# Patient Record
Sex: Female | Born: 1985 | Race: White | Hispanic: No | Marital: Married | Smoking: Current every day smoker
Health system: Southern US, Community
[De-identification: ages and names within clinical notes are randomized; demographics above are authoritative.]

---

## 2017-07-10 ENCOUNTER — Emergency Department
Admission: EM | Admit: 2017-07-10 | Discharge: 2017-07-10 | Disposition: A | Discharge status: Home / Self Care | Payer: Self-pay | Attending: Emergency Medicine | Admitting: Emergency Medicine

## 2017-07-10 DIAGNOSIS — L03116 Cellulitis of left lower limb: Secondary | ICD-10-CM

## 2017-07-10 DIAGNOSIS — F17200 Nicotine dependence, unspecified, uncomplicated: Secondary | ICD-10-CM | POA: Insufficient documentation

## 2017-07-10 DIAGNOSIS — L03113 Cellulitis of right upper limb: Secondary | ICD-10-CM | POA: Insufficient documentation

## 2017-07-10 LAB — CBC WITH DIFFERENTIAL/PLATELET
BASOS ABS: 0.1 10*3/uL (ref 0–0.1)
BASOS PCT: 1 %
Eosinophils Absolute: 0.1 10*3/uL (ref 0–0.7)
Eosinophils Relative: 1 %
HEMATOCRIT: 35.5 % (ref 35.0–47.0)
HEMOGLOBIN: 11.8 g/dL — AB (ref 12.0–16.0)
LYMPHS PCT: 26 %
Lymphs Abs: 3.7 10*3/uL — ABNORMAL HIGH (ref 1.0–3.6)
MCH: 28.1 pg (ref 26.0–34.0)
MCHC: 33.4 g/dL (ref 32.0–36.0)
MCV: 84.2 fL (ref 80.0–100.0)
MONOS PCT: 7 %
Monocytes Absolute: 1 10*3/uL — ABNORMAL HIGH (ref 0.2–0.9)
NEUTROS ABS: 9.3 10*3/uL — AB (ref 1.4–6.5)
NEUTROS PCT: 65 %
Platelets: 387 10*3/uL (ref 150–440)
RBC: 4.21 MIL/uL (ref 3.80–5.20)
RDW: 15.2 % — ABNORMAL HIGH (ref 11.5–14.5)
WBC: 14.2 10*3/uL — ABNORMAL HIGH (ref 3.6–11.0)

## 2017-07-10 LAB — COMPREHENSIVE METABOLIC PANEL
ALBUMIN: 3.9 g/dL (ref 3.5–5.0)
ALK PHOS: 59 U/L (ref 38–126)
ALT: 14 U/L (ref 14–54)
ANION GAP: 9 (ref 5–15)
AST: 27 U/L (ref 15–41)
BUN: 14 mg/dL (ref 6–20)
CALCIUM: 9.1 mg/dL (ref 8.9–10.3)
CO2: 24 mmol/L (ref 22–32)
Chloride: 104 mmol/L (ref 101–111)
Creatinine, Ser: 0.54 mg/dL (ref 0.44–1.00)
GFR calc non Af Amer: >60 mL/min (ref >60)
Glucose, Bld: 89 mg/dL (ref 65–99)
POTASSIUM: 3.7 mmol/L (ref 3.5–5.1)
SODIUM: 137 mmol/L (ref 135–145)
TOTAL PROTEIN: 7.3 g/dL (ref 6.5–8.1)
Total Bilirubin: <0.1 mg/dL — ABNORMAL LOW (ref 0.3–1.2)

## 2017-07-10 MED ORDER — SULFAMETHOXAZOLE-TRIMETHOPRIM 800-160 MG PO TABS: 1.0000 | ORAL_TABLET | Freq: Two times a day (BID) | ORAL | 0 refills | Status: AC

## 2017-07-10 MED ORDER — SULFAMETHOXAZOLE-TRIMETHOPRIM 800-160 MG PO TABS
1.0000 | ORAL_TABLET | Freq: Once | ORAL | Status: AC
  Administered 2017-07-10: 1 via ORAL
  Filled 2017-07-10: qty 1

## 2017-07-10 NOTE — ED Notes (Signed)
Pt reports noticing red spot around two days ago but has several others like it on her lower left leg. Pt denies scratching it, but redness has increased and has gotten painful over last two days. Tender to touch. Area hot to touch.

## 2017-07-10 NOTE — Discharge Instructions (Signed)
Monitor for any changes. Keep the area covered as necessary. Return to the ED for any worsening, spread, or abscess formation. Take the antibiotic as directed. Apply warm compresses to promote healing.

## 2017-07-10 NOTE — ED Provider Notes (Signed)
[facility] Emergency Department Provider Note ____________________________________________  Time seen: 2048  I have reviewed the triage vital signs and the nursing notes.  HISTORY  Chief Complaint  Cellulitis  HPI Kathleen Mayer is a 31 y.o. female presents to the ED for evaluation of a 2-day complaint of a increasingly red, tender area to the posterior left calf.  Patient describes the area began as a small bump, that looked like an insect bite. She denies any frank fevers, but notes she has felt "ill." She denis any spontaneous drainage or streaking. She noted redness surrounding the firm center.   History reviewed. No pertinent past medical history.  There are no active problems to display for this patient.   Past Surgical History:  Procedure Laterality Date  . CESAREAN SECTION      Prior to Admission medications   Medication Sig Start Date End Date Taking? Authorizing Provider  sulfamethoxazole-trimethoprim (BACTRIM DS,SEPTRA DS) 800-160 MG tablet Take 1 tablet 2 (two) times daily by mouth. 07/10/17   Adynn Caseres, Charlesetta IvoryJenise V Bacon, PA-C    Allergies Patient has no known allergies.  No family history on file.  Social History Social History   Tobacco Use  . Smoking status: Current Every Day Smoker  . Smokeless tobacco: Never Used  Substance Use Topics  . Alcohol use: No    Frequency: Never  . Drug use: No    Review of Systems  Constitutional: Negative for fever. Cardiovascular: Negative for chest pain. Respiratory: Negative for shortness of breath. Gastrointestinal: Negative for abdominal pain, vomiting and diarrhea. Musculoskeletal: Negative for back pain. Skin: Negative for rash. Left thigh  Neurological: Negative for headaches, focal weakness or numbness. ____________________________________________  PHYSICAL EXAM:  VITAL SIGNS: ED Triage Vitals [07/10/17 2033]  Enc Vitals Group     BP (!) 141/74     Pulse Rate 100     Resp 17      Temp 99.1 F (37.3 C)     Temp Source Oral     SpO2 99 %     Weight 190 lb (86.2 kg)     Height 5' (1.524 m)     Head Circumference      Peak Flow      Pain Score 5     Pain Loc      Pain Edu?      Excl. in GC?     Constitutional: Alert and oriented. Well appearing and in no distress. Head: Normocephalic and atraumatic. Cardiovascular: Normal rate, regular rhythm. Normal distal pulses. Respiratory: Normal respiratory effort. No wheezes/rales/rhonchi. Musculoskeletal: Nontender with normal range of motion in all extremities.  Neurologic:  Normal gait without ataxia. Normal speech and language. No gross focal neurologic deficits are appreciated. Skin:  Skin is warm, dry and intact. No rash noted. Posterior left thigh with a 2 cm firm, indurated, area with a 6 cm diameter of erythema. No streaking, fluctuance, pointing, or spontaneous drainage is noted.  ____________________________________________   LABS (pertinent positives/negatives)  Labs Reviewed  COMPREHENSIVE METABOLIC PANEL - Abnormal; Notable for the following components:      Result Value   Total Bilirubin <0.1 (*)    All other components within normal limits  CBC WITH DIFFERENTIAL/PLATELET - Abnormal; Notable for the following components:   WBC 14.2 (*)    Hemoglobin 11.8 (*)    RDW 15.2 (*)    Neutro Abs 9.3 (*)    Lymphs Abs 3.7 (*)    Monocytes Absolute 1.0 (*)    All  other components within normal limits  ____________________________________________  PROCEDURES  Procedures Bactrim DS 1 PO ____________________________________________  INITIAL IMPRESSION / ASSESSMENT AND PLAN / ED COURSE  Patient with ED evaluation of a local cellulitis to the left thigh. She is treated with Bactrim DS. The cellulitis border is marked for reference. She will follow-up with this ED for any worsening of symptoms.  ____________________________________________  FINAL CLINICAL IMPRESSION(S) / ED DIAGNOSES  Final diagnoses:   Cellulitis of left lower extremity      Karmen StabsMenshew, Charlesetta IvoryJenise V Bacon, PA-C 07/10/17 2121    Sharman CheekStafford, Phillip, MD 07/11/17 [phone]

## 2017-07-10 NOTE — ED Notes (Signed)
Pt ambulatory upon discharge. Pt verbalized understanding of discharge instructions, follow-up care, s/s of worsening infection and when to return to the ER. Pt A&O x4. Skin warm and dry.

## 2017-07-10 NOTE — ED Triage Notes (Signed)
Patient c/o raised/hardened bump to left upper, posterior calf beginning 2 says ago. Large 3-4" area of redness/warmth encircling area upon inspection. Area tender to touch.

## 2018-10-31 ENCOUNTER — Encounter: Payer: Self-pay | Admitting: Emergency Medicine

## 2018-10-31 ENCOUNTER — Other Ambulatory Visit: Payer: Self-pay

## 2018-10-31 DIAGNOSIS — R05 Cough: Secondary | ICD-10-CM | POA: Diagnosis not present

## 2018-10-31 DIAGNOSIS — Z5321 Procedure and treatment not carried out due to patient leaving prior to being seen by health care provider: Secondary | ICD-10-CM | POA: Diagnosis not present

## 2018-10-31 NOTE — ED Triage Notes (Signed)
Patient ambulatory to triage with steady gait, without difficulty or distress noted, mask in place; pt reports since yesterday having body aches, nonprod cough, fever

## 2018-11-01 ENCOUNTER — Emergency Department: Payer: Medicaid Other

## 2018-11-01 ENCOUNTER — Emergency Department
Admission: EM | Admit: 2018-11-01 | Discharge: 2018-11-01 | Disposition: A | Discharge status: Home / Self Care | Payer: Medicaid Other | Attending: Emergency Medicine | Admitting: Emergency Medicine

## 2018-11-01 LAB — INFLUENZA PANEL BY PCR (TYPE A & B)
Influenza A By PCR: NEGATIVE
Influenza B By PCR: NEGATIVE

## 2018-11-01 NOTE — ED Notes (Signed)
Pt reports leaving now due to long wait; instr to return for any new or worsening symptoms; pt voices understanding 

## 2018-11-01 NOTE — ED Notes (Signed)
Pt ambulatory to STAT desk without difficulty or distress noted; pt updated on wait time and vs retaken

## 2019-02-25 DIAGNOSIS — M545 Low back pain: Secondary | ICD-10-CM | POA: Diagnosis not present

## 2019-02-25 DIAGNOSIS — Z23 Encounter for immunization: Secondary | ICD-10-CM | POA: Diagnosis not present

## 2019-02-25 DIAGNOSIS — Z87891 Personal history of nicotine dependence: Secondary | ICD-10-CM | POA: Diagnosis not present

## 2019-02-25 DIAGNOSIS — Z7689 Persons encountering health services in other specified circumstances: Secondary | ICD-10-CM | POA: Diagnosis not present

## 2019-02-25 DIAGNOSIS — R0602 Shortness of breath: Secondary | ICD-10-CM | POA: Diagnosis not present

## 2019-03-06 DIAGNOSIS — M5127 Other intervertebral disc displacement, lumbosacral region: Secondary | ICD-10-CM | POA: Diagnosis not present

## 2019-03-06 DIAGNOSIS — M5126 Other intervertebral disc displacement, lumbar region: Secondary | ICD-10-CM | POA: Diagnosis not present

## 2019-03-06 DIAGNOSIS — G8929 Other chronic pain: Secondary | ICD-10-CM | POA: Diagnosis not present

## 2019-03-06 DIAGNOSIS — M5137 Other intervertebral disc degeneration, lumbosacral region: Secondary | ICD-10-CM | POA: Diagnosis not present

## 2019-03-06 DIAGNOSIS — R0602 Shortness of breath: Secondary | ICD-10-CM | POA: Diagnosis not present

## 2019-03-06 DIAGNOSIS — M48061 Spinal stenosis, lumbar region without neurogenic claudication: Secondary | ICD-10-CM | POA: Diagnosis not present

## 2019-03-06 DIAGNOSIS — M545 Low back pain: Secondary | ICD-10-CM | POA: Diagnosis not present

## 2019-03-06 DIAGNOSIS — Z87891 Personal history of nicotine dependence: Secondary | ICD-10-CM | POA: Diagnosis not present

## 2019-03-07 DIAGNOSIS — Z130 Encounter for screening for diseases of the blood and blood-forming organs and certain disorders involving the immune mechanism: Secondary | ICD-10-CM | POA: Diagnosis not present

## 2019-03-07 DIAGNOSIS — Z131 Encounter for screening for diabetes mellitus: Secondary | ICD-10-CM | POA: Diagnosis not present

## 2019-03-07 DIAGNOSIS — Z23 Encounter for immunization: Secondary | ICD-10-CM | POA: Diagnosis not present

## 2019-03-07 DIAGNOSIS — Z124 Encounter for screening for malignant neoplasm of cervix: Secondary | ICD-10-CM | POA: Diagnosis not present

## 2019-03-07 DIAGNOSIS — Z0000 Encounter for general adult medical examination without abnormal findings: Secondary | ICD-10-CM | POA: Diagnosis not present

## 2019-03-07 DIAGNOSIS — Z13220 Encounter for screening for lipoid disorders: Secondary | ICD-10-CM | POA: Diagnosis not present

## 2019-04-10 DIAGNOSIS — J069 Acute upper respiratory infection, unspecified: Secondary | ICD-10-CM | POA: Diagnosis not present

## 2019-04-10 DIAGNOSIS — Z720 Tobacco use: Secondary | ICD-10-CM | POA: Diagnosis not present

## 2019-04-10 DIAGNOSIS — R05 Cough: Secondary | ICD-10-CM | POA: Diagnosis not present

## 2019-04-10 DIAGNOSIS — U071 COVID-19: Secondary | ICD-10-CM | POA: Diagnosis not present

## 2019-04-10 DIAGNOSIS — J209 Acute bronchitis, unspecified: Secondary | ICD-10-CM | POA: Diagnosis not present

## 2019-05-04 DIAGNOSIS — T2121XA Burn of second degree of chest wall, initial encounter: Secondary | ICD-10-CM | POA: Diagnosis not present

## 2019-05-04 DIAGNOSIS — T22232A Burn of second degree of left upper arm, initial encounter: Secondary | ICD-10-CM | POA: Diagnosis not present

## 2019-06-19 DIAGNOSIS — T2662XA Corrosion of cornea and conjunctival sac, left eye, initial encounter: Secondary | ICD-10-CM | POA: Diagnosis not present

## 2019-09-19 DIAGNOSIS — S93401A Sprain of unspecified ligament of right ankle, initial encounter: Secondary | ICD-10-CM | POA: Diagnosis not present

## 2019-11-02 DIAGNOSIS — H53143 Visual discomfort, bilateral: Secondary | ICD-10-CM | POA: Diagnosis not present

## 2019-11-02 DIAGNOSIS — H10213 Acute toxic conjunctivitis, bilateral: Secondary | ICD-10-CM | POA: Diagnosis not present

## 2019-11-02 DIAGNOSIS — T2690XA Corrosion of unspecified eye and adnexa, part unspecified, initial encounter: Secondary | ICD-10-CM | POA: Diagnosis not present

## 2020-04-07 DIAGNOSIS — T22292A Burn of second degree of multiple sites of left shoulder and upper limb, except wrist and hand, initial encounter: Secondary | ICD-10-CM | POA: Diagnosis not present

## 2020-04-07 DIAGNOSIS — T22112A Burn of first degree of left forearm, initial encounter: Secondary | ICD-10-CM | POA: Diagnosis not present

## 2020-04-07 DIAGNOSIS — X58XXXA Exposure to other specified factors, initial encounter: Secondary | ICD-10-CM | POA: Diagnosis not present

## 2020-04-15 DIAGNOSIS — U071 COVID-19: Secondary | ICD-10-CM | POA: Diagnosis not present

## 2020-04-25 IMAGING — CR CHEST - 2 VIEW
2 series · 2 of 2 positions shown · non-contrast
Comparison: None.

CLINICAL DATA: Body aches, cough, and fever since yesterday.

EXAM:
CHEST - 2 VIEW

[chest pa]
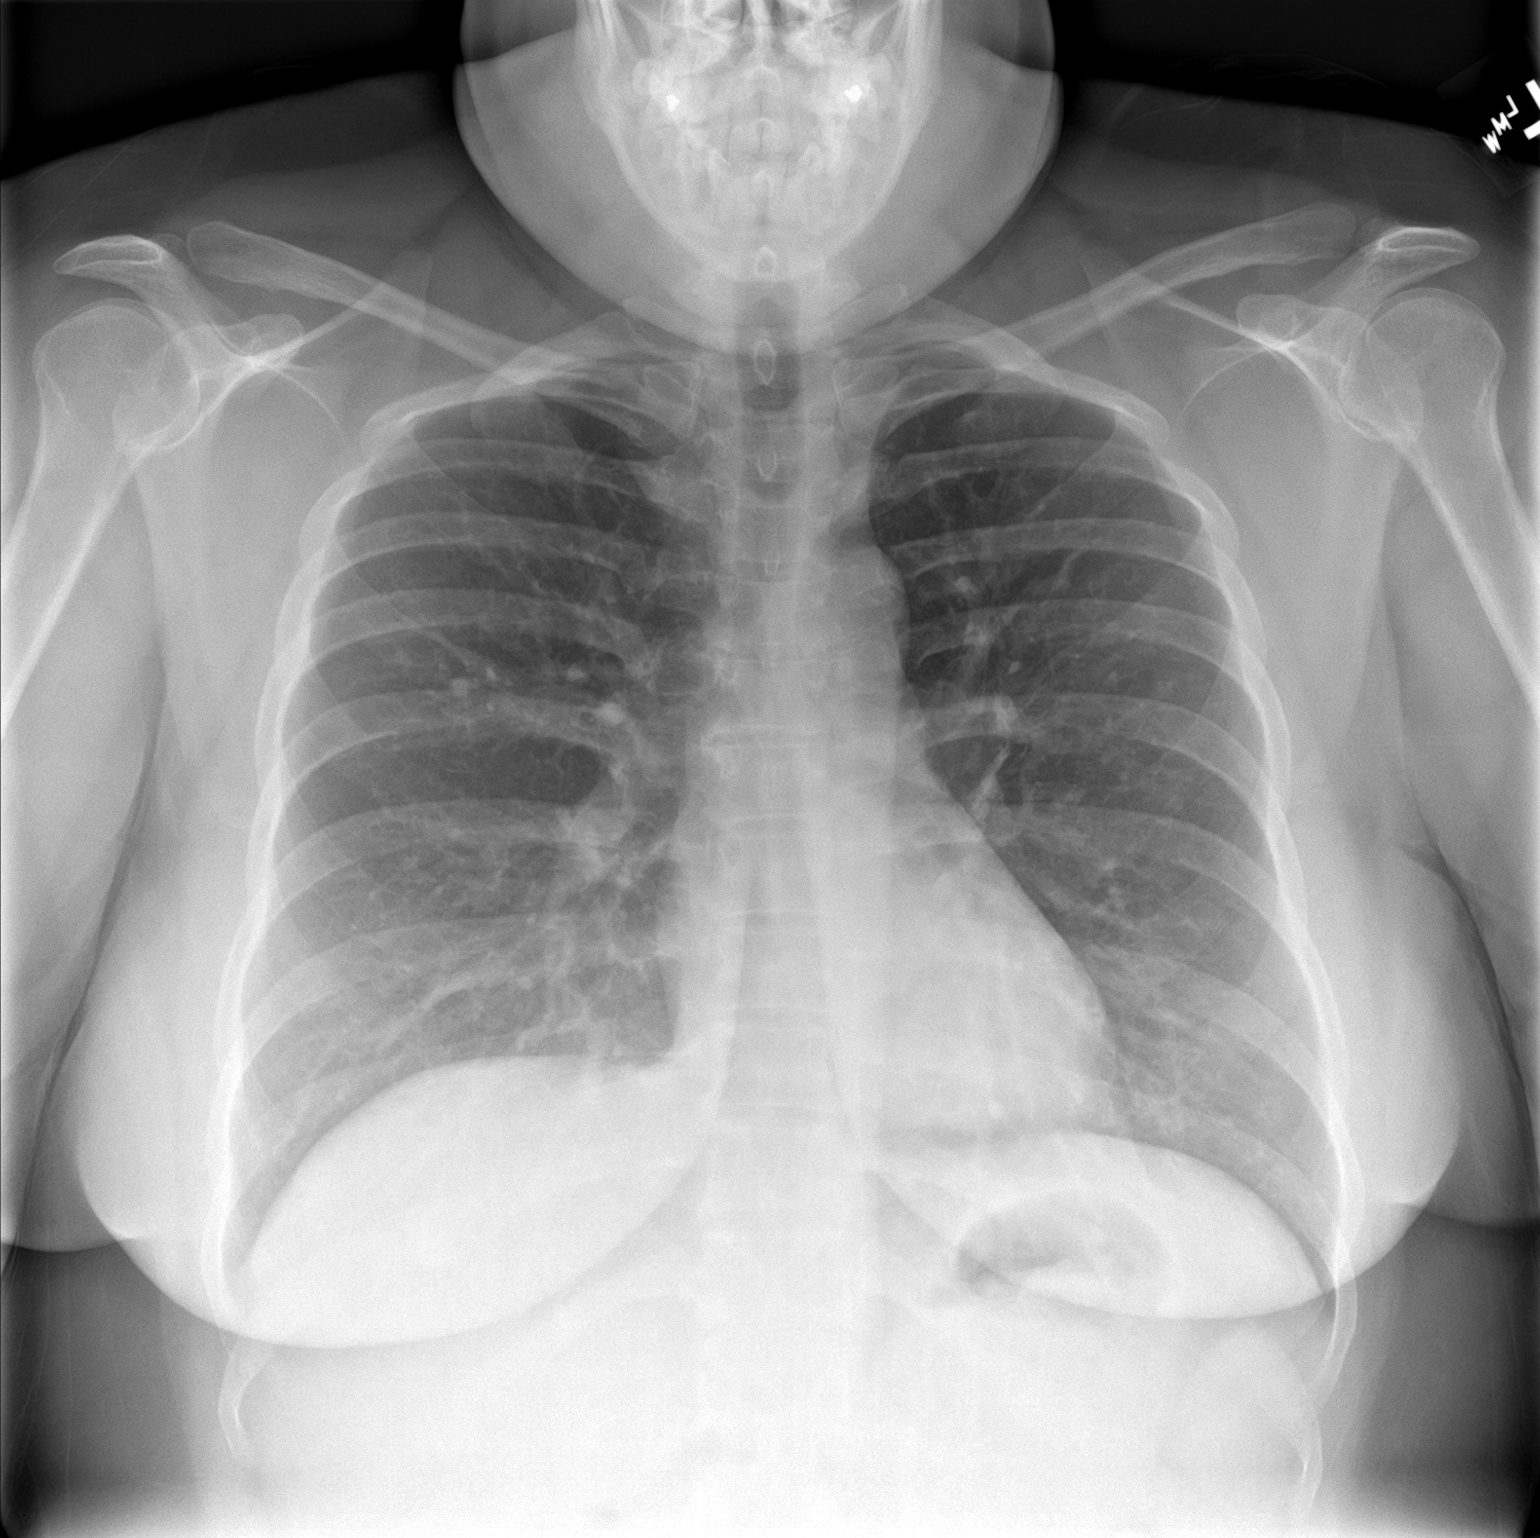

[chest lat]
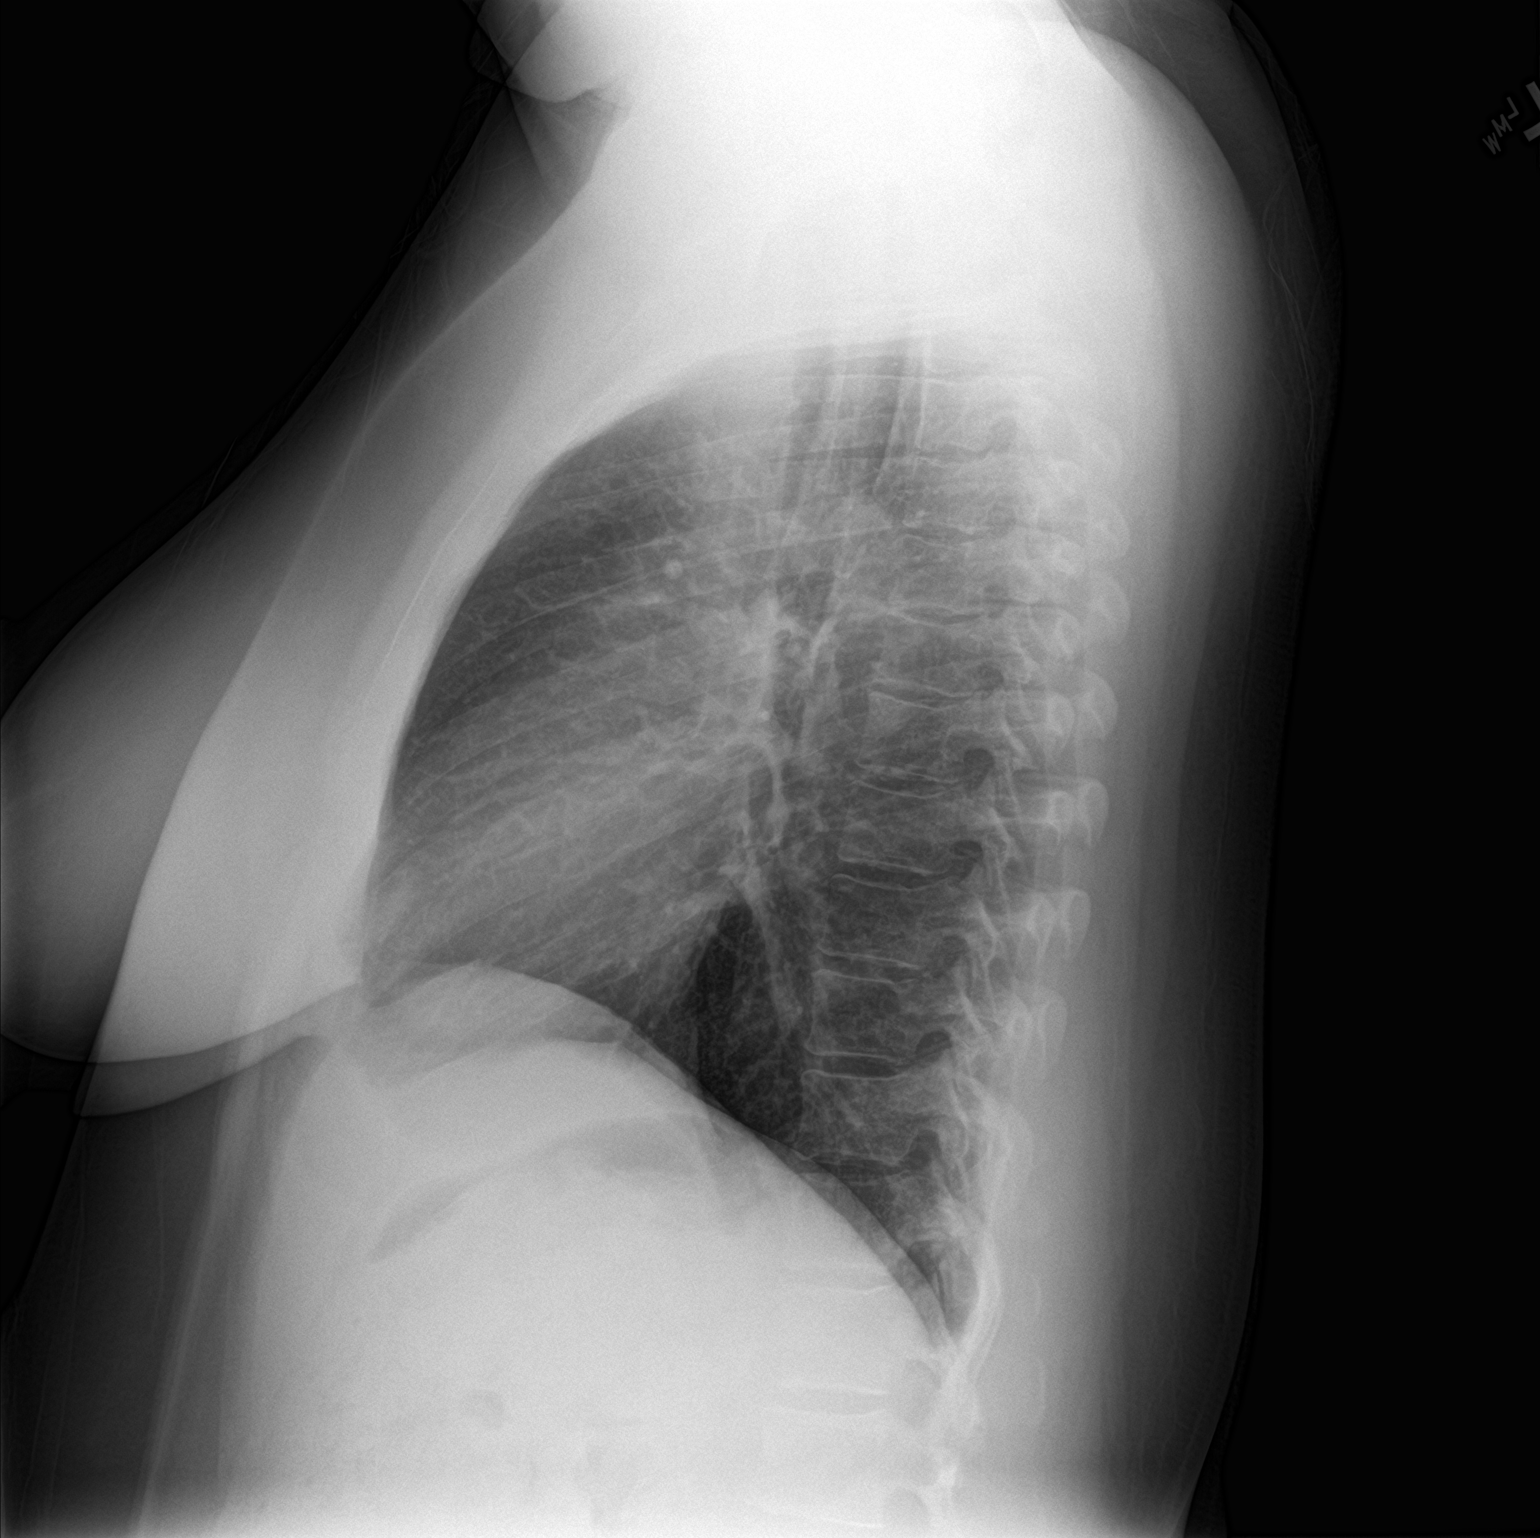

[2 of 2 positions shown; findings below may reference images not displayed]

FINDINGS: The heart size and mediastinal contours are within normal limits.
Both lungs are clear. The visualized skeletal structures are
unremarkable.
IMPRESSION: No active cardiopulmonary disease.
